# Patient Record
Sex: Male | Born: 1980 | Race: White | Hispanic: No | Marital: Married | Smoking: Never smoker
Health system: Southern US, Community
[De-identification: ages and names within clinical notes are randomized; demographics above are authoritative.]

## PROBLEM LIST (undated history)

## (undated) DIAGNOSIS — I341 Nonrheumatic mitral (valve) prolapse: Secondary | ICD-10-CM

## (undated) HISTORY — PX: LAPAROSCOPIC GASTRIC SLEEVE RESECTION: SHX5895

---

## 2016-05-17 ENCOUNTER — Emergency Department: Payer: BLUE CROSS/BLUE SHIELD

## 2016-05-17 ENCOUNTER — Emergency Department
Admission: EM | Admit: 2016-05-17 | Discharge: 2016-05-17 | Disposition: A | Discharge status: Home / Self Care | Payer: BLUE CROSS/BLUE SHIELD | Attending: Emergency Medicine | Admitting: Emergency Medicine

## 2016-05-17 ENCOUNTER — Ambulatory Visit (INDEPENDENT_AMBULATORY_CARE_PROVIDER_SITE_OTHER)
Admission: EM | Admit: 2016-05-17 | Discharge: 2016-05-17 | Disposition: A | Discharge status: Other Acute Inpatient Hospital | Payer: BLUE CROSS/BLUE SHIELD | Source: Home / Self Care

## 2016-05-17 DIAGNOSIS — R030 Elevated blood-pressure reading, without diagnosis of hypertension: Secondary | ICD-10-CM | POA: Diagnosis not present

## 2016-05-17 DIAGNOSIS — I161 Hypertensive emergency: Secondary | ICD-10-CM | POA: Diagnosis not present

## 2016-05-17 DIAGNOSIS — R9431 Abnormal electrocardiogram [ECG] [EKG]: Secondary | ICD-10-CM

## 2016-05-17 DIAGNOSIS — F419 Anxiety disorder, unspecified: Secondary | ICD-10-CM

## 2016-05-17 DIAGNOSIS — R42 Dizziness and giddiness: Secondary | ICD-10-CM | POA: Diagnosis present

## 2016-05-17 DIAGNOSIS — I674 Hypertensive encephalopathy: Secondary | ICD-10-CM | POA: Diagnosis not present

## 2016-05-17 DIAGNOSIS — Z79899 Other long term (current) drug therapy: Secondary | ICD-10-CM | POA: Diagnosis not present

## 2016-05-17 DIAGNOSIS — R1310 Dysphagia, unspecified: Secondary | ICD-10-CM | POA: Diagnosis not present

## 2016-05-17 DIAGNOSIS — R531 Weakness: Secondary | ICD-10-CM | POA: Insufficient documentation

## 2016-05-17 DIAGNOSIS — I10 Essential (primary) hypertension: Secondary | ICD-10-CM | POA: Diagnosis not present

## 2016-05-17 DIAGNOSIS — Z9884 Bariatric surgery status: Secondary | ICD-10-CM | POA: Diagnosis not present

## 2016-05-17 DIAGNOSIS — F17200 Nicotine dependence, unspecified, uncomplicated: Secondary | ICD-10-CM | POA: Diagnosis not present

## 2016-05-17 HISTORY — DX: Nonrheumatic mitral (valve) prolapse: I34.1

## 2016-05-17 LAB — TROPONIN I

## 2016-05-17 LAB — COMPREHENSIVE METABOLIC PANEL
ALK PHOS: 67 U/L (ref 38–126)
ALT: 163 U/L — AB (ref 17–63)
AST: 205 U/L — AB (ref 15–41)
Albumin: 5 g/dL (ref 3.5–5.0)
Anion gap: 17 — ABNORMAL HIGH (ref 5–15)
BUN: 10 mg/dL (ref 6–20)
CALCIUM: 9.6 mg/dL (ref 8.9–10.3)
CHLORIDE: 102 mmol/L (ref 101–111)
CO2: 18 mmol/L — ABNORMAL LOW (ref 22–32)
CREATININE: 0.86 mg/dL (ref 0.61–1.24)
Glucose, Bld: 109 mg/dL — ABNORMAL HIGH (ref 65–99)
Potassium: 4.1 mmol/L (ref 3.5–5.1)
Sodium: 137 mmol/L (ref 135–145)
Total Bilirubin: 1.2 mg/dL (ref 0.3–1.2)
Total Protein: 8.8 g/dL — ABNORMAL HIGH (ref 6.5–8.1)

## 2016-05-17 LAB — GLUCOSE, CAPILLARY: Glucose-Capillary: 107 mg/dL — ABNORMAL HIGH (ref 65–99)

## 2016-05-17 LAB — CBC
HCT: 47 % (ref 40.0–52.0)
Hemoglobin: 16.2 g/dL (ref 13.0–18.0)
MCH: 34.6 pg — AB (ref 26.0–34.0)
MCHC: 34.4 g/dL (ref 32.0–36.0)
MCV: 100.7 fL — AB (ref 80.0–100.0)
PLATELETS: 147 10*3/uL — AB (ref 150–440)
RBC: 4.67 MIL/uL (ref 4.40–5.90)
RDW: 13.4 % (ref 11.5–14.5)
WBC: 5.8 10*3/uL (ref 3.8–10.6)

## 2016-05-17 LAB — AMMONIA: Ammonia: 19 umol/L (ref 9–35)

## 2016-05-17 MED ORDER — ASPIRIN 81 MG PO CHEW
324.0000 mg | CHEWABLE_TABLET | Freq: Once | ORAL | Status: AC
  Administered 2016-05-17: 324 mg via ORAL
  Filled 2016-05-17: qty 4

## 2016-05-17 MED ORDER — NICOTINE 21 MG/24HR TD PT24
MEDICATED_PATCH | TRANSDERMAL | Status: AC
  Filled 2016-05-17: qty 1

## 2016-05-17 MED ORDER — THIAMINE HCL 100 MG/ML IJ SOLN
100.0000 mg | Freq: Once | INTRAMUSCULAR | Status: AC
  Administered 2016-05-17: 100 mg via INTRAVENOUS
  Filled 2016-05-17: qty 2

## 2016-05-17 MED ORDER — SODIUM CHLORIDE 0.9 % IV BOLUS (SEPSIS)
500.0000 mL | Freq: Once | INTRAVENOUS | Status: AC
  Administered 2016-05-17: 500 mL via INTRAVENOUS

## 2016-05-17 MED ORDER — LABETALOL HCL 5 MG/ML IV SOLN
10.0000 mg | Freq: Once | INTRAVENOUS | Status: AC
  Administered 2016-05-17: 10 mg via INTRAVENOUS
  Filled 2016-05-17: qty 4

## 2016-05-17 MED ORDER — LISINOPRIL 10 MG PO TABS
10.0000 mg | ORAL_TABLET | Freq: Once | ORAL | Status: AC
  Administered 2016-05-17: 10 mg via ORAL
  Filled 2016-05-17: qty 1

## 2016-05-17 MED ORDER — LISINOPRIL 10 MG PO TABS: 10.0000 mg | ORAL_TABLET | Freq: Every day | ORAL | 1 refills | Status: AC

## 2016-05-17 MED ORDER — METOCLOPRAMIDE HCL 5 MG/ML IJ SOLN
10.0000 mg | Freq: Once | INTRAMUSCULAR | Status: AC
  Administered 2016-05-17: 10 mg via INTRAVENOUS
  Filled 2016-05-17: qty 2

## 2016-05-17 NOTE — ED Triage Notes (Signed)
Pt comes into the ED via EMS from [facility] with c/o having N/V since early this morning, states he started driving back from class in [place] today and began feeling off with intermittent difficulty with speech and feeling shaky..Marland Kitchen

## 2016-05-17 NOTE — ED Provider Notes (Signed)
[facility] Emergency Department Provider Note  ____________________________________________  Time seen: Approximately 3:50 PM  I have reviewed the triage vital signs and the nursing notes.   HISTORY  Chief Complaint Emesis and Hypertension   HPI Jordan Goodman is a 35 y.o. male history of alcohol abuse, mitral valve prolapse status post repair, gastric bypass who presents for evaluation of difficulty finding words and elevated blood pressure. Patient reports that he woke up this morning feeling well him drove to [employer] to study. When he got there he felt very nauseous and dizzy and drove back home. When he got home he was shaky and diaphoretic, had a couple of episodes of nonbloody nonbilious emesis. Patient reports that he has had a mild occipital headache and felt very confused most of the day today. At home he checked his BP which was in the 190s. He endorses remote h/o of prediabetes and HTN prior to gastric bypass 5 years ago. 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 He went to urgent care and it was noticed by the provider there that he was having mild expressive aphasia. Patient was last seen normal yesterday evening by his wife. When she left this morning patient was still asleep. Patient denies unilateral numbness or weakness, changes in vision, facial droop, slurred speech. He does have mild expressive aphasia but is able to answer questions. Patient reports that he drinks 2 bottles of wine a night and had this amount yesterday evening. He does not smoke. He has a family history of brain tumor in his maternal side but no strokes. Also has a shunt family history of ischemic heart disease. He denies chest pain, shortness of breath, back pain, abdominal pain, nausea, vomiting, recent illness, neck stiffness, fever, chills.  Past Medical History:  Diagnosis Date  . Mitral valve prolapse     There are no active problems to  display for this patient.   Past Surgical History:  Procedure Laterality Date  . LAPAROSCOPIC GASTRIC SLEEVE RESECTION      Prior to Admission medications   Medication Sig Start Date End Date Taking? Authorizing Provider  busPIRone (BUSPAR) 5 MG tablet Take 5 mg by mouth 2 (two) times daily.   Yes Historical Provider, MD  sertraline (ZOLOFT) 100 MG tablet Take 100 mg by mouth daily.   Yes Historical Provider, MD  traZODone (DESYREL) 100 MG tablet Take 100 mg by mouth at bedtime.   Yes Historical Provider, MD  lisinopril (PRINIVIL,ZESTRIL) 10 MG tablet Take 1 tablet (10 mg total) by mouth daily. 05/17/16 05/17/17  Nita Sicklearolina Makenleigh Crownover, MD    Allergies Patient has no known allergies.  Family History  Problem Relation Age of Onset  . Diabetes Father     Social History Social History  Substance Use Topics  . Smoking status: Never Smoker  . Smokeless tobacco: Current User  . Alcohol use Yes    Review of Systems  Constitutional: Negative for fever. + confusion and generalized weakness Eyes: Negative for visual changes. ENT: Negative for sore throat. Cardiovascular: Negative for chest pain. Respiratory: Negative for shortness of breath. Gastrointestinal: Negative for abdominal pain, vomiting or diarrhea. Genitourinary: Negative for dysuria. Musculoskeletal: Negative for back pain. Skin: Negative for rash. Neurological: + headache and expressive aphasia. No weakness or numbness.  ____________________________________________   PHYSICAL EXAM:  VITAL SIGNS: ED Triage Vitals  Enc Vitals Group     BP 05/17/16 1446 (!) 158/96     Pulse  Rate 05/17/16 1446 89     Resp 05/17/16 1446 18     Temp 05/17/16 1446 98.2 F (36.8 C)     Temp Source 05/17/16 1446 Oral     SpO2 05/17/16 1446 100 %     Weight 05/17/16 1447 290 lb (131.5 kg)     Height 05/17/16 1447 6\' 1"  (1.854 m)     Head Circumference --      Peak Flow --      Pain Score 05/17/16 1447 0     Pain Loc --      Pain  Edu? --      Excl. in GC? --     Constitutional: Alert and oriented, no distress, mild expressive aphasia but ansewring to questions appropriately. HEENT:      Head: Normocephalic and atraumatic.         Eyes: Conjunctivae are normal. Sclera is non-icteric. EOMI. PERRL      Mouth/Throat: Mucous membranes are moist.       Neck: Supple with no signs of meningismus. Cardiovascular: Regular rate and rhythm. No murmurs, gallops, or rubs. 2+ symmetrical distal pulses are present in all extremities. No JVD. Respiratory: Normal respiratory effort. Lungs are clear to auscultation bilaterally. No wheezes, crackles, or rhonchi.  Gastrointestinal: Soft, non tender, and non distended with positive bowel sounds. No rebound or guarding. Musculoskeletal: Nontender with normal range of motion in all extremities. No edema, cyanosis, or erythema of extremities. Neurologic: Mild expressive aphasia but able to answer questions. A & O x3, PERRL, no nystagmus, CN II-XII intact, motor testing reveals good tone and bulk throughout. There is no evidence of pronator drift or dysmetria. Muscle strength is 5/5 throughout. Deep tendon reflexes are 2+ throughout with downgoing toes. Sensory examination is intact. Gait deferred. Patient has a slight tremor in all 4 extremities Skin: Skin is warm, dry and intact. No rash noted. Psychiatric: Mood and affect are normal. Speech and behavior are normal.  ____________________________________________   LABS (all labs ordered are listed, but only abnormal results are displayed)  Labs Reviewed  COMPREHENSIVE METABOLIC PANEL - Abnormal; Notable for the following:       Result Value   CO2 18 (*)    Glucose, Bld 109 (*)    Total Protein 8.8 (*)    AST 205 (*)    ALT 163 (*)    Anion gap 17 (*)    All other components within normal limits  CBC - Abnormal; Notable for the following:    MCV 100.7 (*)    MCH 34.6 (*)    Platelets 147 (*)    All other components within normal  limits  TROPONIN I  AMMONIA   ____________________________________________  EKG   ED ECG REPORT I, Nita Sickle, the attending physician, personally viewed and interpreted this ECG. Normal sinus rhythm, rate of 93, normal intervals, normal axis, no ST elevations or depressions. Normal EKG. ____________________________________________  RADIOLOGY  Head CT: negative  MRI: Normal brain MRI. ____________________________________________   PROCEDURES  Procedure(s) performed: None Procedures Critical Care performed:  None ____________________________________________   INITIAL IMPRESSION / ASSESSMENT AND PLAN / ED COURSE   35 y.o. male history of alcohol abuse, mitral valve prolapse status post repair, gastric bypass who presents for evaluation of difficulty finding words, generalized weakness, mild occipital HA, nausea, vomiting, and elevated blood pressure since this morning. Patient last seen normal last evening by his wife. Patient is neurologically intact other than mild expressive aphasia. Patient also noted to have  a tremor. Head CT with no evidence of stroke. Presentation concerning for either hepatic encephalopathy, PRESS or very small ischemic stroke. Consulted Dr. Thad Rangereynolds, Neurology who recommended lowering BP and obtaining MRI. Labs pending. Will give ASA, IVF, IV reglan, IV thiamine, Iv labetalol.  Clinical Course as of May 17 1845  [facility]  [facility] CT and MRI negative. Patient's BP is markedly improved. Patient feels back to his baseline, has normal speech, remained otherwise neurologically intact. Wife is at the bedside and agrees the patient is back to his baseline. Patient does [facility] [employer]. We'll discharge him home on lisinopril and close follow-up with PCP.  I have also discussed with patient that his liver enzymes are elevated consistent with his history of alcohol abuse. I counseled on morbidity associated with heavy  drinking and liver disease. Patient has a psychiatrist and will discuss with him for help to stop drinking.  [CV]    Clinical Course User Index [CV] Nita Sicklearolina Aryelle Figg, MD    Pertinent labs & imaging results that were available during my care of the patient were reviewed by me and considered in my medical decision making (see chart for details).    ____________________________________________   FINAL CLINICAL IMPRESSION(S) / ED DIAGNOSES  Final diagnoses:  Hypertensive emergency  Hypertensive encephalopathy      NEW MEDICATIONS STARTED DURING THIS VISIT:  New Prescriptions   LISINOPRIL (PRINIVIL,ZESTRIL) 10 MG TABLET    Take 1 tablet (10 mg total) by mouth daily.     Note:  This document was prepared using Dragon voice recognition software and may include unintentional dictation errors.    Nita Sicklearolina Lavel Rieman, MD 05/17/16 [phone]

## 2016-05-17 NOTE — ED Provider Notes (Signed)
[facility]    CSN: [id number] Arrival date & time: 05/17/16  1306     History   Chief Complaint Chief Complaint  Patient presents with  . Hypertension    HPI Jordan Goodman is a 35 y.o. male.   Patient's here because of elevated blood pressure generalized weakness and shaking all over. He reports going to his class at [employer]. He became lightheaded dizzy weak chest tightness and had trouble speaking. This happened about 2:00 this morning he left class. He has had a history of anxiety attacks before and thought this may be another anxiety attack. He spent an indeterminate amount time at the parking lot of a grocery store before driving home. Once he got home he got his blood pressure systolic was greater than 185. He's had a history of elevated blood pressure but not like this and this was before he had his gastric sleeve applied. The gastric sleeve was done about 5-6 years ago and has lost 540 pounds he admits to gaining about 50 pounds back. States he's never had a stroke TIA we still having difficulty with his speech. He states that the chest discomfort has improved since being here. He does have a history of mitral valve prolapse. No known drug allergies. Habits he does not smoke but he does dip snuff. His father does have diabetes.  No other surgeries. His wife has come with him to the urgent care to be seen and evaluated.   The history is provided by the patient and the spouse.  Hypertension  This is a new problem. The current episode started 1 to 2 hours ago. The problem has been gradually improving. Associated symptoms include headaches. Pertinent negatives include no chest pain, no abdominal pain and no shortness of breath. Nothing aggravates the symptoms. Nothing relieves the symptoms. He has tried nothing for the symptoms. The treatment provided no relief.    Past Medical History:  Diagnosis Date  . Mitral valve prolapse     There are no active problems to  display for this patient.   Past Surgical History:  Procedure Laterality Date  . LAPAROSCOPIC GASTRIC SLEEVE RESECTION         Home Medications    Prior to Admission medications   Medication Sig Start Date End Date Taking? Authorizing Provider  busPIRone (BUSPAR) 5 MG tablet Take 5 mg by mouth 2 (two) times daily.   Yes Historical Provider, MD  sertraline (ZOLOFT) 100 MG tablet Take 100 mg by mouth daily.   Yes Historical Provider, MD  traZODone (DESYREL) 100 MG tablet Take 100 mg by mouth at bedtime.   Yes Historical Provider, MD    Family History Family History  Problem Relation Age of Onset  . Diabetes Father     Social History Social History  Substance Use Topics  . Smoking status: Never Smoker  . Smokeless tobacco: Current User  . Alcohol use Yes     Allergies   Patient has no known allergies.   Review of Systems Review of Systems  Constitutional: Positive for activity change.  Respiratory: Positive for chest tightness. Negative for shortness of breath.   Cardiovascular: Negative for chest pain.  Gastrointestinal: Negative for abdominal pain.  Neurological: Positive for dizziness, speech difficulty, light-headedness and headaches.  Psychiatric/Behavioral: The patient is nervous/anxious.   All other systems reviewed and are negative.    Physical Exam Triage Vital Signs ED Triage Vitals  Enc Vitals Group     BP 05/17/16 1329 (!) 183/95  Pulse Rate 05/17/16 1329 (!) 105     Resp 05/17/16 1329 18     Temp 05/17/16 1329 97.7 F (36.5 C)     Temp Source 05/17/16 1329 Tympanic     SpO2 05/17/16 1329 96 %     Weight 05/17/16 1330 290 lb (131.5 kg)     Height 05/17/16 1330 6\' 1"  (1.854 m)     Head Circumference --      Peak Flow --      Pain Score 05/17/16 1333 4     Pain Loc --      Pain Edu? --      Excl. in GC? --    No data found.   Updated Vital Signs BP (!) 183/95 (BP Location: Left Arm)   Pulse (!) 105   Temp 97.7 F (36.5 C)  (Tympanic)   Resp 18   Ht 6\' 1"  (1.854 m)   Wt 290 lb (131.5 kg)   SpO2 96%   BMI 38.26 kg/m   Visual Acuity Right Eye Distance:   Left Eye Distance:   Bilateral Distance:    Right Eye Near:   Left Eye Near:    Bilateral Near:     Physical Exam  Constitutional: He is oriented to person, place, and time. He appears well-developed and well-nourished.  HENT:  Head: Normocephalic and atraumatic.  Right Ear: External ear normal.  Left Ear: External ear normal.  Mouth/Throat: Oropharynx is clear and moist.  Eyes: EOM are normal. Pupils are equal, round, and reactive to light.  Neck: Normal range of motion. Neck supple. No tracheal deviation present. No thyromegaly present.  Cardiovascular: Normal rate, regular rhythm and normal heart sounds.   Pulmonary/Chest: Effort normal and breath sounds normal. No respiratory distress. He has no wheezes.  Abdominal: Soft.  Musculoskeletal: Normal range of motion. He exhibits no edema, tenderness or deformity.  Neurological: He is alert and oriented to person, place, and time. He displays normal reflexes. No cranial nerve deficit. Coordination normal.  Skin: Skin is warm and dry.  Psychiatric: He has a normal mood and affect.  Vitals reviewed.    UC Treatments / Results  Labs (all labs ordered are listed, but only abnormal results are displayed) Labs Reviewed  GLUCOSE, CAPILLARY - Abnormal; Notable for the following:       Result Value   Glucose-Capillary 107 (*)    All other components within normal limits  CBG MONITORING, ED    EKG  EKG Interpretation None     ED ECG REPORT I, Aron Needles H, the attending physician, personally viewed and interpreted this ECG.   Date: 05/17/2016  EKG Time: 13:34:19  Rate:96  Rhythm: normal EKG, normal sinus rhythm, unchanged from previous tracings, prolonged QT interval  Axis: 29  Intervals:nonspecific intraventricular conduction delay  ST&T Change: none  Radiology No results  found.  Procedures Procedures (including critical care time)  Medications Ordered in UC Medications - No data to display   Initial Impression / Assessment and Plan / UC Course  I have reviewed the triage vital signs and the nursing notes.  Pertinent labs & imaging results that were available during my care of the patient were reviewed by me and considered in my medical decision making (see chart for details).  Clinical Course    Discussed with Judeth Cornfield charge nurse [facility] ED will transfer him there by EMS. Explained to him and the charge nurse that his symptoms started about 10:00 which is the best estimation of the  time he can give us which puts him basically outside the window since the survey 4 hours by my calculation for clot busting.  Final Clinical Impressions(s) / UC Diagnoses   Final diagnoses:  Dysphagia, unspecified type  Hypertension, unspecified type  Elevated blood pressure reading  Anxiety    New Prescriptions New Prescriptions   No medications on file     Note: This dictation was prepared with Dragon dictation along with smaller phrase technology. Any transcriptional errors that result from this process are unintentional.   Hassan RowanEugene Loc Feinstein, MD 05/17/16 1437

## 2016-05-17 NOTE — ED Triage Notes (Addendum)
Patient complains of shakiness with hypertension. Patient states that BP at home was 170/125. Patient states that the shakiness started this morning and has been off and on. Patient reports that he has been having trouble with his speech at times too. Patient reports that he feels very off today, and states that his father is a diabetic and he would like to make sure he is not diabetic.

## 2017-04-10 IMAGING — MR MR HEAD W/O CM
10 series · 48 of 48 positions shown · non-contrast
Comparison: Prior CT from earlier the same day.

CLINICAL DATA: Initial evaluation for acute lightheadedness,
dizziness, generalized weakness. Difficulty speaking.

EXAM:
MRI HEAD WITHOUT CONTRAST
TECHNIQUE: Multiplanar, multiecho pulse sequences of the brain and surrounding
structures were obtained without intravenous contrast.

[Series 2: T1 · sagittal · 5.0mm · 0.45mm/px · 4 of 29 slices shown (1 of 2)]
[im 1/29]
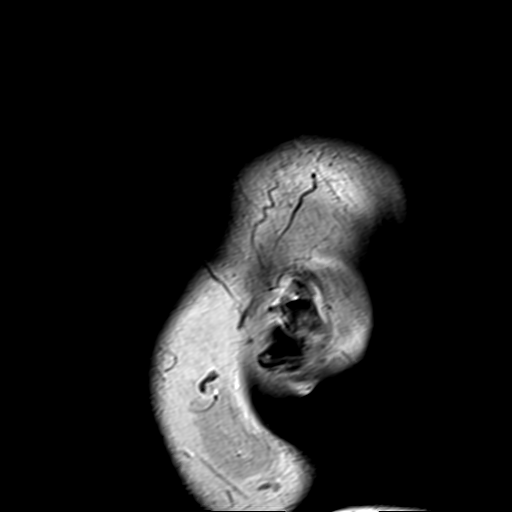
[im 10/29]
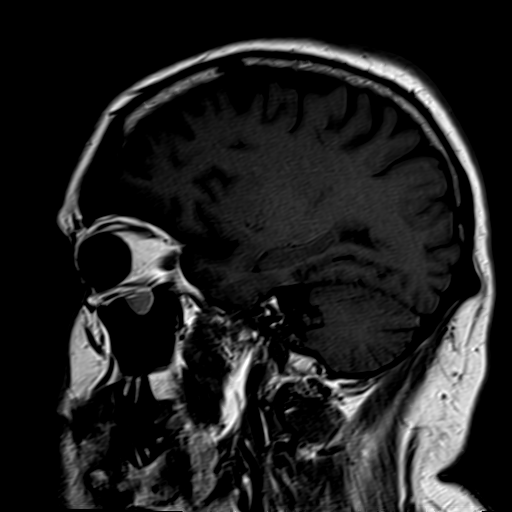
[im 19/29]
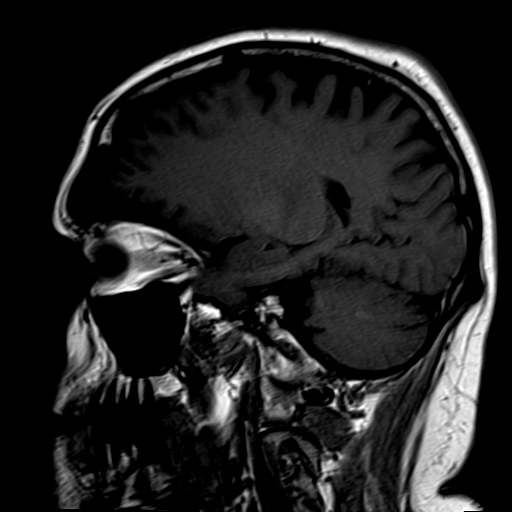
[im 29/29]
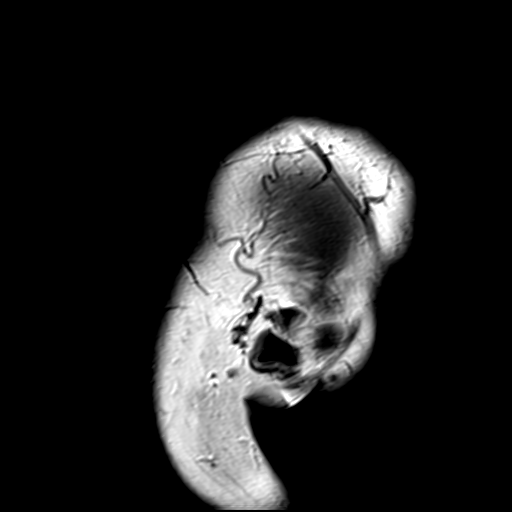

[Series 4: DWI · axial · 3.0mm · 1.80mm/px · z∈[-45,+115]mm · 6 of 55 slices shown (1 of 2)]
[im 1/55]
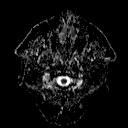
[im 11/55]
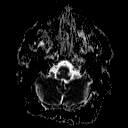
[im 22/55]
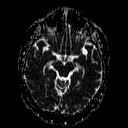
[im 33/55]
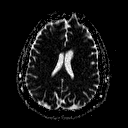
[im 44/55]
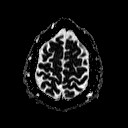
[im 55/55]
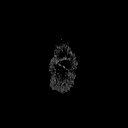

[Series 6: DWI · coronal · 3.0mm · 1.80mm/px · 6 of 49 slices shown (2 of 2)]
[im 1/49]
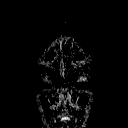
[im 10/49]
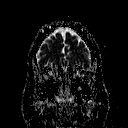
[im 20/49]
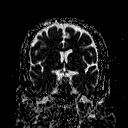
[im 29/49]
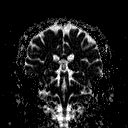
[im 39/49]
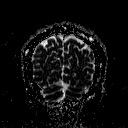
[im 49/49]
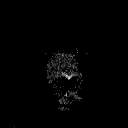

[Series 7: T2 · axial · 5.0mm · 0.72mm/px · z∈[-48,+118]mm · 3 of 27 slices shown (1 of 3)]
[im 1/27]
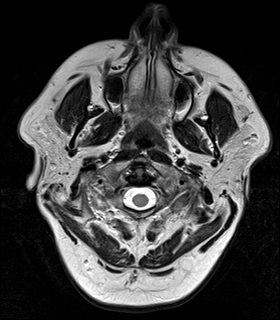
[im 14/27]
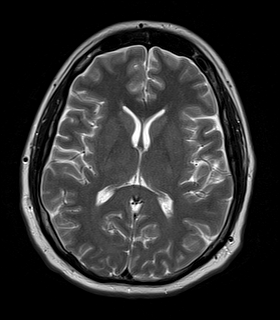
[im 27/27]
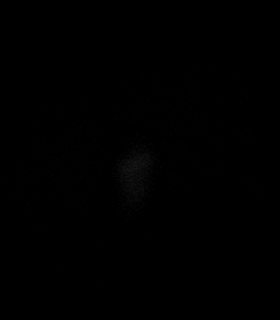

[Series 8: FLAIR · axial · 5.0mm · 0.45mm/px · z∈[-48,+118]mm · 3 of 27 slices shown]
[im 1/27]
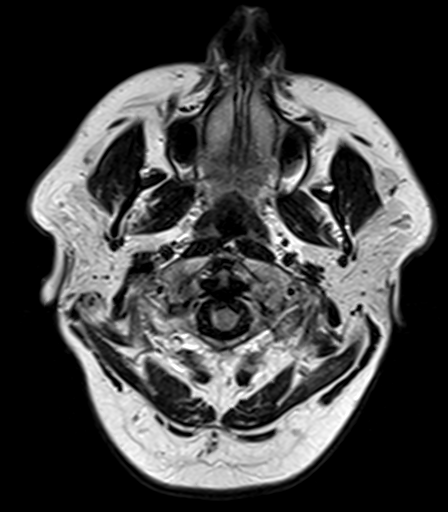
[im 14/27]
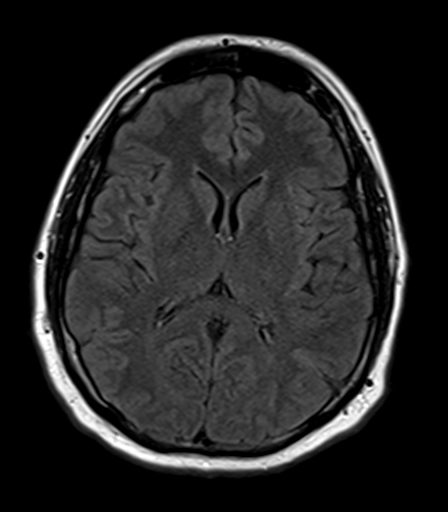
[im 27/27]
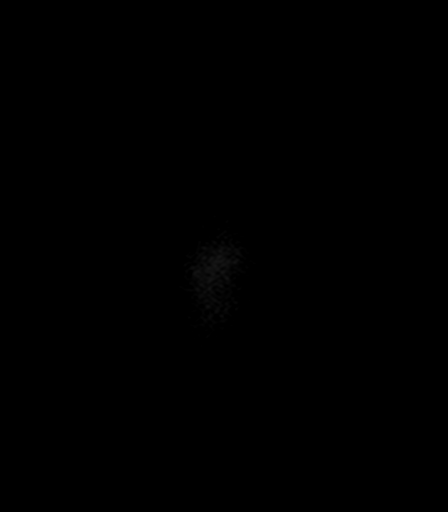

[Series 9: T2 · axial · 5.0mm · 0.45mm/px · z∈[-48,+118]mm · 3 of 27 slices shown (2 of 3)]
[im 1/27]
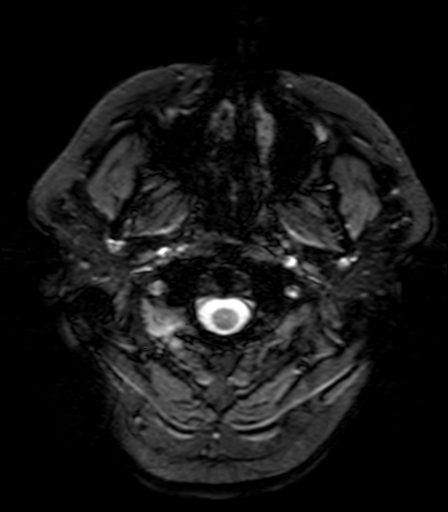
[im 14/27]
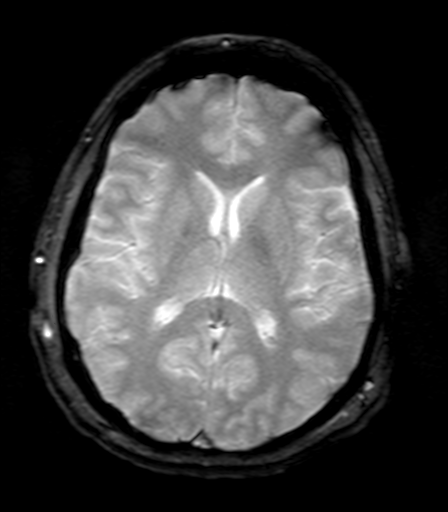
[im 27/27]
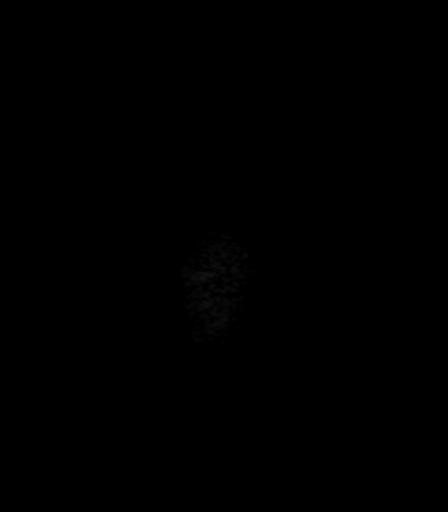

[Series 10: T1 · axial · 3.0mm · 1.00mm/px · z∈[-50,+124]mm · 7 of 60 slices shown (2 of 2)]
[im 1/60]
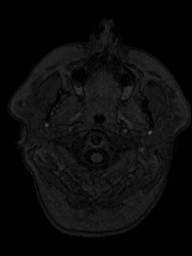
[im 10/60]
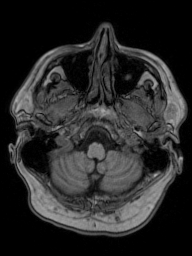
[im 20/60]
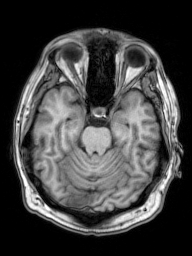
[im 30/60]
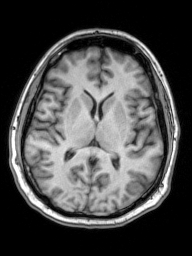
[im 40/60]
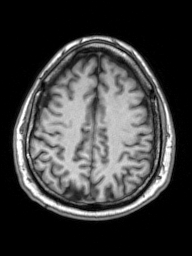
[im 50/60]
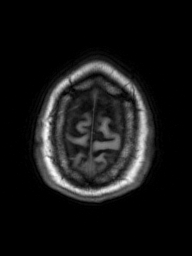
[im 60/60]
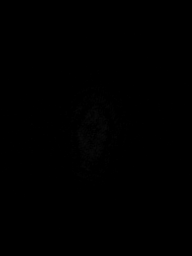

[Series 11: T2 · coronal · 5.0mm · 0.49mm/px · 4 of 31 slices shown (3 of 3)]
[im 1/31]
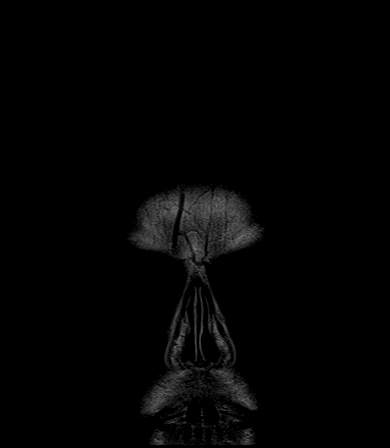
[im 11/31]
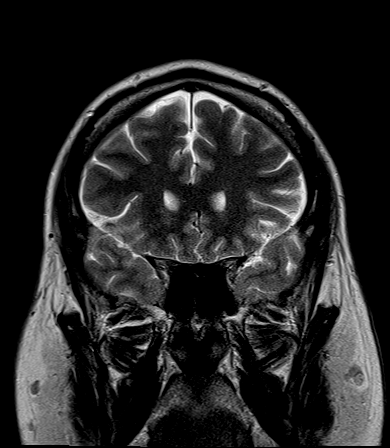
[im 21/31]
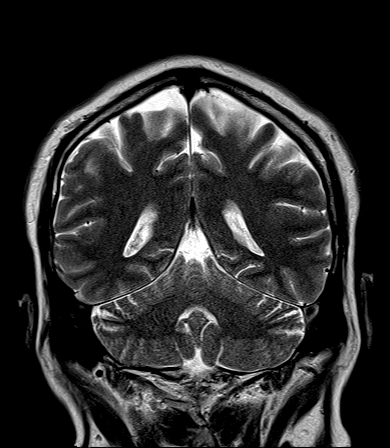
[im 31/31]
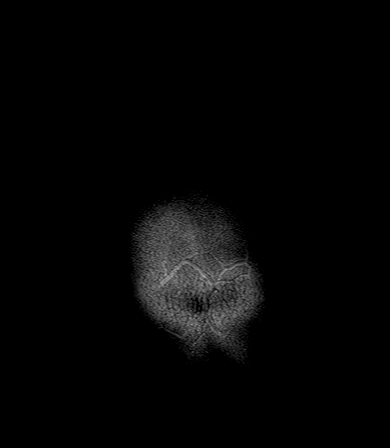

[Series 100: ax (id) · axial · 3.0mm · 1.80mm/px · z∈[-45,+115]mm · 6 of 55 slices shown]
[im 1/55]
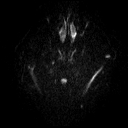
[im 11/55]
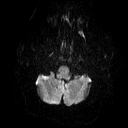
[im 22/55]
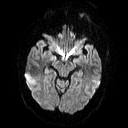
[im 33/55]
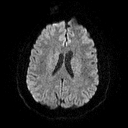
[im 44/55]
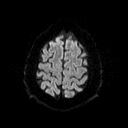
[im 55/55]
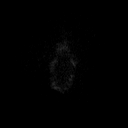

[Series 101: cor (id) · coronal · 3.0mm · 1.80mm/px · 6 of 48 slices shown]
[im 1/48]
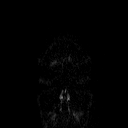
[im 10/48]
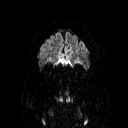
[im 19/48]
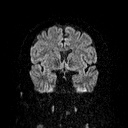
[im 29/48]
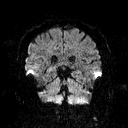
[im 38/48]
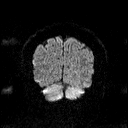
[im 48/48]
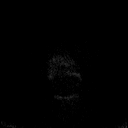

[48 of 48 positions shown; findings below may reference images not displayed]

FINDINGS: Brain: Cerebral volume within normal limits. No focal parenchymal
signal abnormality identified. No significant cerebral white matter
disease.

No abnormal foci of restricted diffusion to suggest acute or
subacute ischemia. Gray-white matter differentiation well
maintained. No evidence for acute or chronic intracranial
hemorrhage. No evidence for chronic infarction.

No mass lesion, midline shift or mass effect. No hydrocephalus. No
extra-axial fluid collection. Major dural sinuses are grossly
patent.

Pituitary gland and suprasellar region within normal limits.

Vascular: Major intracranial vascular flow voids are maintained.

Skull and upper cervical spine: Craniocervical junction normal.
Visualized upper cervical spine unremarkable. Bone marrow signal
intensity normal. No scalp soft tissue abnormality.

Sinuses/Orbits: Globes and orbital soft tissues within normal
limits. Retention cysts noted within the left maxillary sinus.
Paranasal sinuses are otherwise clear. Trace opacity left mastoid
air cells. Inner ear structures normal. The
IMPRESSION: Normal brain MRI.
